# Patient Record
Sex: Male | Born: 1985 | Hispanic: No | Marital: Single | Smoking: Current some day smoker
Health system: Southern US, Community
[De-identification: ages and names within clinical notes are randomized; demographics above are authoritative.]

---

## 2019-04-01 ENCOUNTER — Encounter (HOSPITAL_COMMUNITY): Payer: Self-pay

## 2019-04-01 ENCOUNTER — Emergency Department (HOSPITAL_COMMUNITY): Payer: No Typology Code available for payment source

## 2019-04-01 ENCOUNTER — Emergency Department (HOSPITAL_COMMUNITY)
Admission: EM | Admit: 2019-04-01 | Discharge: 2019-04-01 | Disposition: A | Discharge status: Home / Self Care | Payer: No Typology Code available for payment source | Attending: Emergency Medicine | Admitting: Emergency Medicine

## 2019-04-01 ENCOUNTER — Other Ambulatory Visit: Payer: Self-pay

## 2019-04-01 DIAGNOSIS — M25572 Pain in left ankle and joints of left foot: Secondary | ICD-10-CM | POA: Insufficient documentation

## 2019-04-01 DIAGNOSIS — Y999 Unspecified external cause status: Secondary | ICD-10-CM | POA: Diagnosis not present

## 2019-04-01 DIAGNOSIS — F17210 Nicotine dependence, cigarettes, uncomplicated: Secondary | ICD-10-CM | POA: Diagnosis not present

## 2019-04-01 DIAGNOSIS — S43004A Unspecified dislocation of right shoulder joint, initial encounter: Secondary | ICD-10-CM | POA: Diagnosis not present

## 2019-04-01 DIAGNOSIS — S4991XA Unspecified injury of right shoulder and upper arm, initial encounter: Secondary | ICD-10-CM | POA: Diagnosis present

## 2019-04-01 DIAGNOSIS — Y939 Activity, unspecified: Secondary | ICD-10-CM | POA: Insufficient documentation

## 2019-04-01 DIAGNOSIS — Y929 Unspecified place or not applicable: Secondary | ICD-10-CM | POA: Insufficient documentation

## 2019-04-01 MED ORDER — FENTANYL CITRATE (PF) 100 MCG/2ML IJ SOLN
75.0000 ug | Freq: Once | INTRAMUSCULAR | Status: AC
  Administered 2019-04-01: 75 ug via INTRAVENOUS
  Filled 2019-04-01: qty 2

## 2019-04-01 MED ORDER — FENTANYL CITRATE (PF) 100 MCG/2ML IJ SOLN
25.0000 ug | Freq: Once | INTRAMUSCULAR | Status: AC
  Administered 2019-04-01: 25 ug via INTRAVENOUS
  Filled 2019-04-01: qty 2

## 2019-04-01 MED ORDER — HYDROCODONE-ACETAMINOPHEN 5-325 MG PO TABS: 1.0000 | ORAL_TABLET | Freq: Four times a day (QID) | ORAL | 0 refills | Status: AC | PRN

## 2019-04-01 MED ORDER — LORAZEPAM 2 MG/ML IJ SOLN: 1.0000 mg | Freq: Once | INTRAMUSCULAR | Status: DC

## 2019-04-01 MED ORDER — LIDOCAINE HCL (PF) 1 % IJ SOLN
30.0000 mL | Freq: Once | INTRAMUSCULAR | Status: AC
  Administered 2019-04-01: 30 mL
  Filled 2019-04-01: qty 30

## 2019-04-01 MED ORDER — OXYCODONE-ACETAMINOPHEN 5-325 MG PO TABS
2.0000 | ORAL_TABLET | Freq: Once | ORAL | Status: AC
  Administered 2019-04-01: 2 via ORAL
  Filled 2019-04-01: qty 2
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
## 2019-04-01 NOTE — ED Notes (Signed)
Pt verbalized understanding of prescriptions, follow up care and s/s requiring return to ED. Pt had no additional questions at this time
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
## 2019-04-01 NOTE — Discharge Instructions (Signed)
Please follow up with orthopedics, number listed, to schedule appointment in 1 week. Tylenol/motrin for pain. Keep Sling in place as well until follow up.  Return to the ED for worsening pain, deformity, or inability to use the arm.

## 2021-07-27 IMAGING — DX DG SHOULDER 2+V PORT*R*
2 series · 2 of 2 positions shown · non-contrast
Comparison: None.

CLINICAL DATA: Dislocation

EXAM:
PORTABLE RIGHT SHOULDER

[shoulder ap]
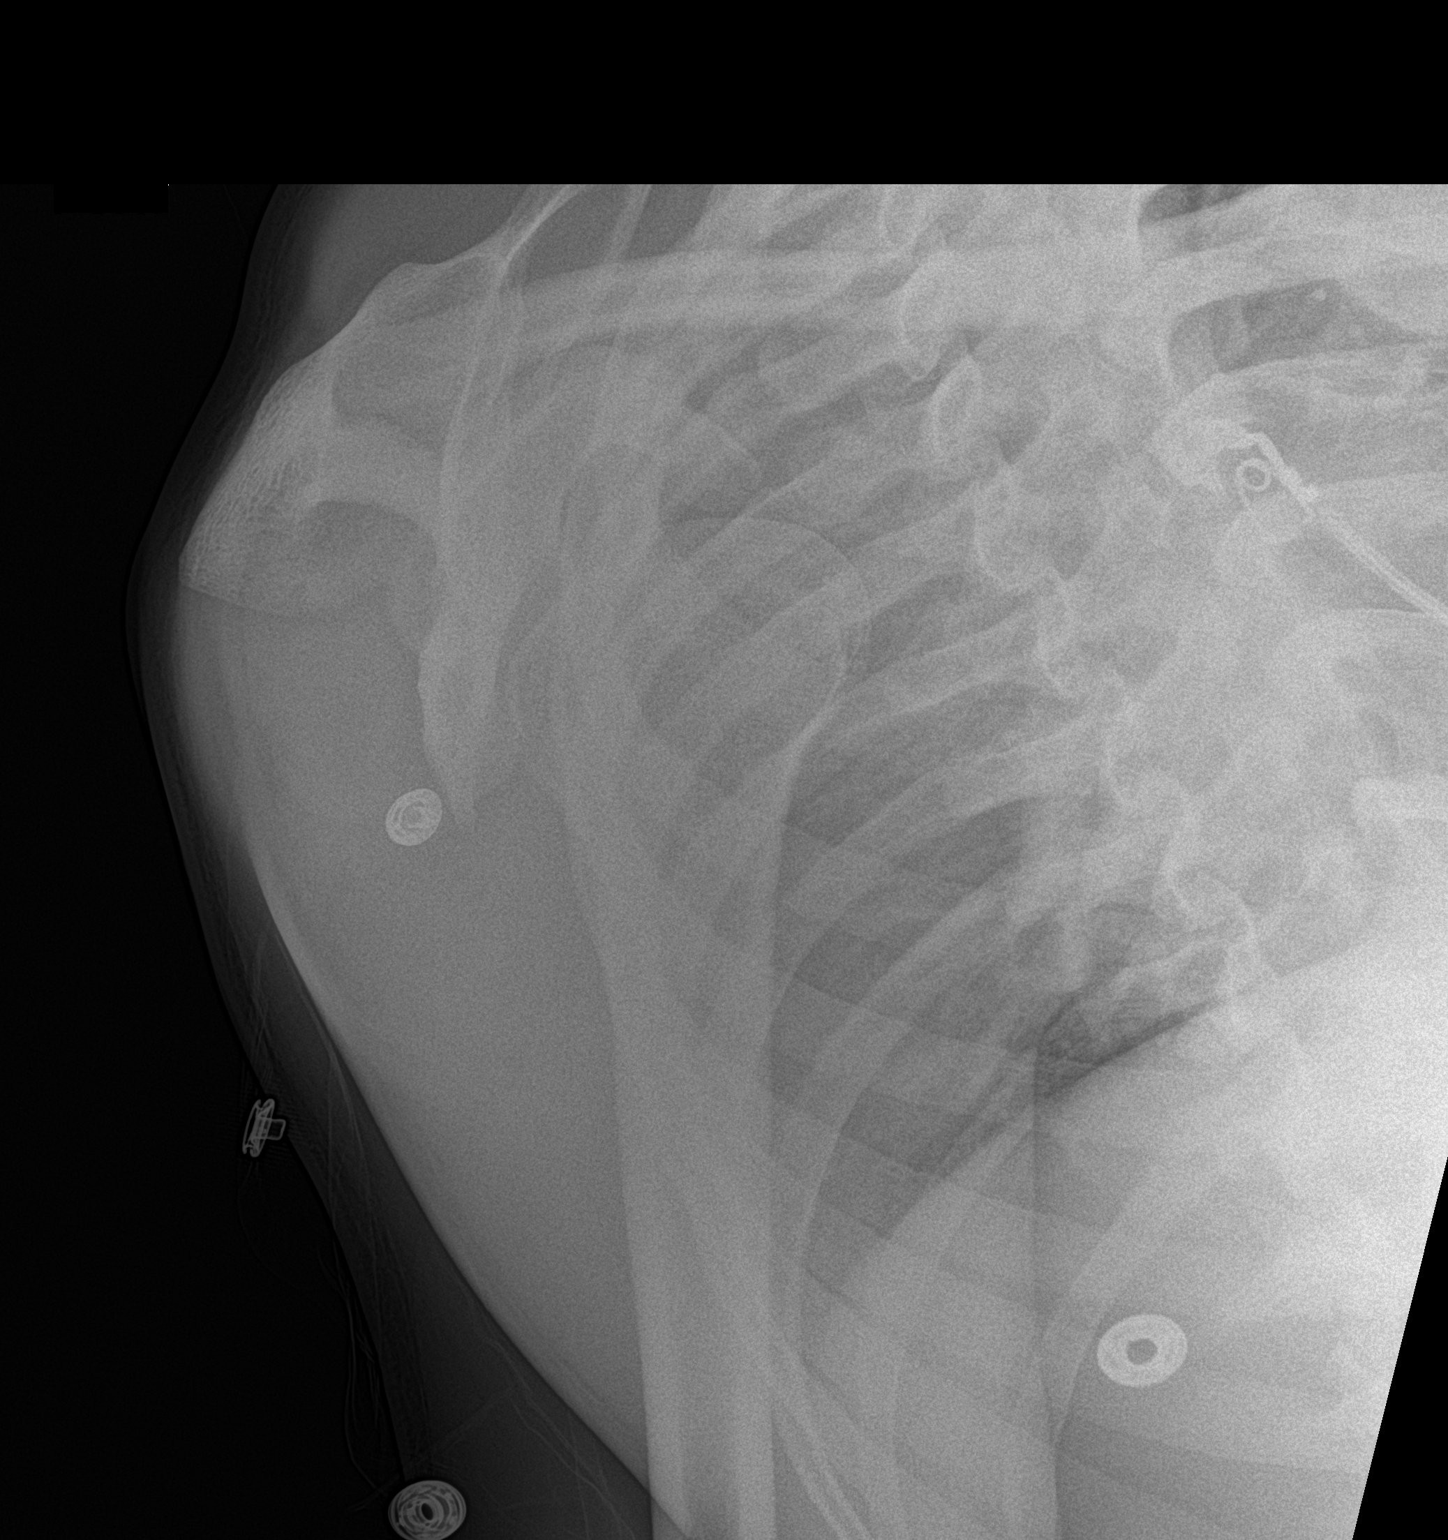

[shoulder obl]
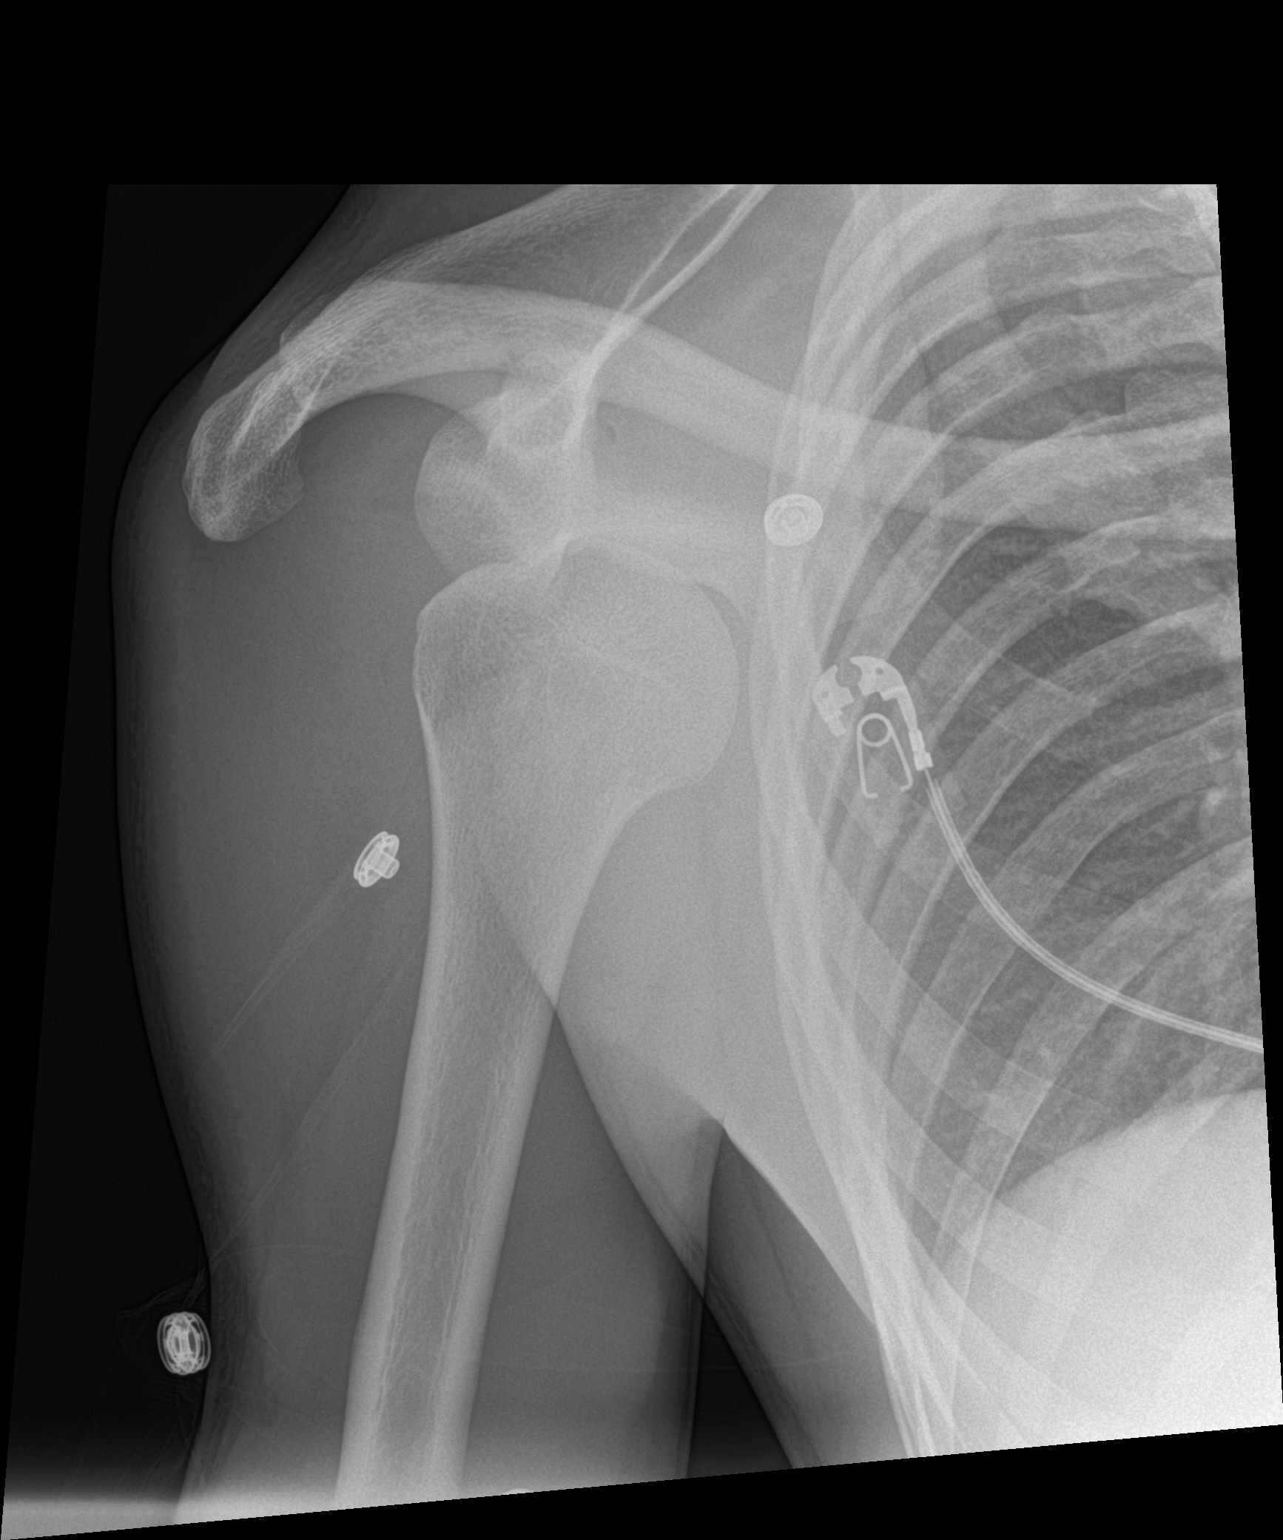

[2 of 2 positions shown; findings below may reference images not displayed]

FINDINGS: There is an anterior inferior glenohumeral dislocation. There is a
probable Hill-Sachs deformity of the humeral head.
IMPRESSION: Anterior inferior glenohumeral dislocation. Probable Hill-Sachs
deformity of the humeral head.
